# Patient Record
Sex: Female | Born: 1995 | Race: White | Hispanic: No | Marital: Single | Smoking: Current some day smoker
Health system: Southern US, Community
[De-identification: ages and names within clinical notes are randomized; demographics above are authoritative.]

## PROBLEM LIST (undated history)

## (undated) DIAGNOSIS — F419 Anxiety disorder, unspecified: Secondary | ICD-10-CM

## (undated) DIAGNOSIS — F429 Obsessive-compulsive disorder, unspecified: Secondary | ICD-10-CM

## (undated) DIAGNOSIS — F32A Depression, unspecified: Secondary | ICD-10-CM

## (undated) DIAGNOSIS — F329 Major depressive disorder, single episode, unspecified: Secondary | ICD-10-CM

## (undated) HISTORY — DX: Obsessive-compulsive disorder, unspecified: F42.9

## (undated) HISTORY — DX: Major depressive disorder, single episode, unspecified: F32.9

## (undated) HISTORY — DX: Anxiety disorder, unspecified: F41.9

## (undated) HISTORY — DX: Depression, unspecified: F32.A

---
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
## 2015-01-09 ENCOUNTER — Encounter: Payer: Self-pay | Admitting: *Deleted

## 2015-01-09 ENCOUNTER — Ambulatory Visit: Payer: BLUE CROSS/BLUE SHIELD | Admitting: Family Medicine

## 2015-01-21 ENCOUNTER — Encounter: Payer: Self-pay | Admitting: Family Medicine

## 2015-01-21 ENCOUNTER — Encounter: Payer: Self-pay | Admitting: *Deleted

## 2015-01-21 ENCOUNTER — Ambulatory Visit (INDEPENDENT_AMBULATORY_CARE_PROVIDER_SITE_OTHER): Payer: BLUE CROSS/BLUE SHIELD | Admitting: Family Medicine

## 2015-01-21 VITALS — BP 110/70 | HR 92 | Resp 16 | Ht 65.0 in | Wt 112.0 lb

## 2015-01-21 DIAGNOSIS — F17210 Nicotine dependence, cigarettes, uncomplicated: Secondary | ICD-10-CM

## 2015-01-21 DIAGNOSIS — F429 Obsessive-compulsive disorder, unspecified: Secondary | ICD-10-CM

## 2015-01-21 DIAGNOSIS — R062 Wheezing: Secondary | ICD-10-CM | POA: Insufficient documentation

## 2015-01-21 DIAGNOSIS — Z720 Tobacco use: Secondary | ICD-10-CM | POA: Diagnosis not present

## 2015-01-21 DIAGNOSIS — F42 Obsessive-compulsive disorder: Secondary | ICD-10-CM | POA: Diagnosis not present

## 2015-01-21 DIAGNOSIS — J3089 Other allergic rhinitis: Secondary | ICD-10-CM

## 2015-01-21 DIAGNOSIS — J309 Allergic rhinitis, unspecified: Secondary | ICD-10-CM | POA: Insufficient documentation

## 2015-01-21 MED ORDER — PREDNISONE 5 MG PO TABS: 5.0000 mg | ORAL_TABLET | Freq: Two times a day (BID) | ORAL | Status: AC

## 2015-01-21 MED ORDER — METHYLPREDNISOLONE ACETATE 80 MG/ML IJ SUSP
80.0000 mg | Freq: Once | INTRAMUSCULAR | Status: AC
  Administered 2015-01-21: 80 mg via INTRAMUSCULAR

## 2015-01-21 MED ORDER — AZELASTINE HCL 0.1 % NA SOLN: 2.0000 | Freq: Two times a day (BID) | NASAL | Status: DC

## 2015-01-21 MED ORDER — BUDESONIDE-FORMOTEROL FUMARATE 80-4.5 MCG/ACT IN AERO: 2.0000 | INHALATION_SPRAY | Freq: Two times a day (BID) | RESPIRATORY_TRACT | Status: DC

## 2015-01-21 MED ORDER — MONTELUKAST SODIUM 10 MG PO TABS: 10.0000 mg | ORAL_TABLET | Freq: Every day | ORAL | Status: AC

## 2015-01-21 NOTE — Patient Instructions (Addendum)
F/u as before, PLEASE contact me if still symptomatic despite maximal med therapy  You will be referred to allergistand lung specialist if needed  Stopping smoking will ABSOLUTELY reduce cough episodes    OK continuue daily  Allegra  Depo medrool today for uncontrolled allergies and 5 days of prednisone  Tylenol 500 mg one to two as needed for back pain, along with good back hygiene and massages/rubs

## 2015-01-21 NOTE — Progress Notes (Signed)
   Subjective:    Patient ID: Denise BelliniMadalyn Wang, female    DOB: 09/14/1995, 19 y.o.   MRN: [id number]  HPI    Denise BelliniMadalyn Rehfeld     MRN: [id number]      DOB: 07/08/1995   HPI Ms. Diona BrownerMcDowell is here for follow up and re-evaluation of chronic medical conditions, medication management and review of any available recent lab and radiology data.  Preventive health is updated, specifically  Cancer screening and Immunization.   C/o ongoing cough and wheezing which sometimes awakens her at night. Never been formally diagnosed with asthma as far as she can recall  C/o uncontrolled upper airway allergy symptoms, congestion, clear nasal and eye drainage and sneezing  ROS Denies recent fever or chills. . Denies chest pains, palpitations and leg swelling Denies abdominal pain, nausea, vomiting,diarrhea or constipation.   Denies dysuria, frequency, hesitancy or incontinence. Denies joint pain, swelling and limitation in mobility. Denies headaches, seizures, numbness, or tingling. Denies uncontrolled  depression, anxiety or insomnia. Denies skin break down or rash.   PE  BP 110/70 mmHg  Pulse 92  Resp 16  Ht 5\' 5"  (1.651 m)  Wt 112 lb (50.803 kg)  BMI 18.64 kg/m2  SpO2 98%  Patient alert and oriented and in no cardiopulmonary distress.  HEENT: No facial asymmetry, EOMI,   oropharynx pink and moist.  Neck supple no JVD, no mass.Eryhtema and edema of nasal mucosa, excessive sneezing and watery eyes  Chest: decreased though adequate air entry, scattered high pitched wheezes,no crackles  CVS: S1, S2 no murmurs, no S3.Regular rate.  ABD: Soft non tender.   Ext: No edema  MS: Adequate ROM spine, shoulders, hips and knees.  Skin: Intact, no ulcerations or rash noted.  Psych: Good eye contact, normal affect. Memory intact not anxious or depressed appearing.  CNS: CN 2-12 intact, power,  normal throughout.no focal deficits noted.   Assessment & Plan   Wheezing Uncontrolled, depo medrol  in office and 5 day course of oral steroid, refer for PFT, if normal , will need to see allergist or pulmonologist, hopefully test will definitively state whether pt is asthmatic or otherwise  Allergic rhinitis Daily allegra to continue ,will add  ingulair  Light cigarette smoker Patient counseled for approximately 5 minutes regarding the health risks of ongoing nicotine use, specifically all types of cancer, heart disease, stroke and respiratory failure. The options available for help with cessation ,the behavioral changes to assist the process, and the option to either gradully reduce usage  Or abruptly stop.is also discussed. Pt is also encouraged to set specific goals in number of cigarettes used daily, as well as to set a quit date.  Number of cigarettes/cigars currently smoking daily: 3 to 5  OCD (obsessive compulsive disorder) managed by psych and reports improvement in symptoms      Review of Systems     Objective:   Physical Exam        Assessment & Plan:

## 2015-01-25 ENCOUNTER — Other Ambulatory Visit (HOSPITAL_COMMUNITY): Payer: Self-pay | Admitting: Psychiatry

## 2015-01-31 ENCOUNTER — Ambulatory Visit (HOSPITAL_COMMUNITY)
Admission: RE | Admit: 2015-01-31 | Discharge: 2015-01-31 | Disposition: A | Discharge status: Home / Self Care | Payer: BLUE CROSS/BLUE SHIELD | Source: Ambulatory Visit | Attending: Family Medicine | Admitting: Family Medicine

## 2015-01-31 DIAGNOSIS — R062 Wheezing: Secondary | ICD-10-CM

## 2015-02-12 ENCOUNTER — Ambulatory Visit (HOSPITAL_COMMUNITY)
Admission: RE | Admit: 2015-02-12 | Discharge: 2015-02-12 | Disposition: A | Discharge status: Home / Self Care | Payer: BLUE CROSS/BLUE SHIELD | Source: Ambulatory Visit | Attending: Family Medicine | Admitting: Family Medicine

## 2015-02-12 DIAGNOSIS — R062 Wheezing: Secondary | ICD-10-CM | POA: Diagnosis present

## 2015-02-12 LAB — PULMONARY FUNCTION TEST
DL/VA % pred: 100 %
DL/VA: 4.93 ml/min/mmHg/L
DLCO unc % pred: 89 %
DLCO unc: 22.98 ml/min/mmHg
FEF 25-75 Pre: 2.11 L/s
FEF2575-%Pred-Pre: 55 %
FEV1-%Pred-Pre: 75 %
FEV1-Pre: 2.58 L
FEV1FVC-%Pred-Pre: 78 %
FEV6-%Pred-Pre: 96 %
FEV6-Pre: 3.79 L
FEV6FVC-%Pred-Pre: 100 %
FVC-%Pred-Pre: 97 %
Pre FEV1/FVC ratio: 68 %
Pre FEV6/FVC Ratio: 100 %
RV % pred: 203 %
RV: 2.48 L
TLC % pred: 116 %
TLC: 6.05 L

## 2015-02-25 ENCOUNTER — Other Ambulatory Visit (HOSPITAL_COMMUNITY): Payer: Self-pay | Admitting: Psychiatry

## 2015-03-17 ENCOUNTER — Other Ambulatory Visit: Payer: Self-pay | Admitting: Family Medicine

## 2015-03-30 NOTE — Assessment & Plan Note (Signed)
managed by psych and reports improvement in symptoms

## 2015-03-30 NOTE — Assessment & Plan Note (Addendum)
Uncontrolled, depo medrol in office and 5 day course of oral steroid, refer for PFT, if normal , will need to see allergist or pulmonologist, hopefully test will definitively state whether pt is asthmatic or otherwise

## 2015-03-30 NOTE — Assessment & Plan Note (Signed)
Patient counseled for approximately 5 minutes regarding the health risks of ongoing nicotine use, specifically all types of cancer, heart disease, stroke and respiratory failure. The options available for help with cessation ,the behavioral changes to assist the process, and the option to either gradully reduce usage  Or abruptly stop.is also discussed. Pt is also encouraged to set specific goals in number of cigarettes used daily, as well as to set a quit date.  Number of cigarettes/cigars currently smoking daily: 3 to 5  

## 2015-03-30 NOTE — Assessment & Plan Note (Addendum)
Daily allegra to continue ,will add  ingulair

## 2015-03-31 ENCOUNTER — Ambulatory Visit (HOSPITAL_COMMUNITY): Payer: Self-pay | Admitting: Psychiatry

## 2015-04-01 ENCOUNTER — Ambulatory Visit: Payer: Self-pay | Admitting: Family Medicine

## 2015-04-01 ENCOUNTER — Telehealth: Payer: Self-pay | Admitting: Family Medicine

## 2015-04-01 ENCOUNTER — Encounter: Payer: Self-pay | Admitting: *Deleted

## 2015-04-01 NOTE — Telephone Encounter (Signed)
Noted  

## 2015-04-01 NOTE — Telephone Encounter (Signed)
Noted and agree. 

## 2015-04-03 ENCOUNTER — Ambulatory Visit (INDEPENDENT_AMBULATORY_CARE_PROVIDER_SITE_OTHER): Payer: BLUE CROSS/BLUE SHIELD | Admitting: Psychiatry

## 2015-04-03 ENCOUNTER — Encounter (HOSPITAL_COMMUNITY): Payer: Self-pay | Admitting: Psychiatry

## 2015-04-03 VITALS — BP 119/69 | HR 96 | Ht 65.0 in | Wt 119.0 lb

## 2015-04-03 DIAGNOSIS — F321 Major depressive disorder, single episode, moderate: Secondary | ICD-10-CM | POA: Diagnosis not present

## 2015-04-03 MED ORDER — FLUOXETINE HCL 40 MG PO CAPS: 40.0000 mg | ORAL_CAPSULE | Freq: Every day | ORAL | Status: DC
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
## 2015-04-08 ENCOUNTER — Telehealth: Payer: Self-pay | Admitting: Family Medicine

## 2015-04-08 NOTE — Telephone Encounter (Addendum)
[name] ( her mother in law) reportedly called in requesting drug testing on this patient, stating that her behavior is currently questionable, and that she is at risk of "being cut off" Based on HIPPA law, I am unable to violate patient's privacy, I am unable to order the test as patient is an adult When I do see patient for an office visit , I will address her, Denise Wang's  stated health concerns, and go over her social history again, which specifically asks about illicit drug use, and only if her  her history and exam support a need for drug testing I will order the test Caller's name is not on list of contacts as far as I can tell, so we do not need to have further discussions with the caller about the patient if this is indeed substantiated.

## 2015-04-08 NOTE — Telephone Encounter (Signed)
Noted  

## 2015-04-08 NOTE — Telephone Encounter (Signed)
Noted and agree. 

## 2015-04-19 ENCOUNTER — Ambulatory Visit (HOSPITAL_COMMUNITY)
Admission: RE | Admit: 2015-04-19 | Discharge: 2015-04-19 | Disposition: A | Discharge status: Home / Self Care | Payer: BLUE CROSS/BLUE SHIELD | Attending: Psychiatry | Admitting: Psychiatry

## 2015-04-19 ENCOUNTER — Encounter (HOSPITAL_COMMUNITY): Payer: Self-pay | Admitting: *Deleted

## 2015-04-19 DIAGNOSIS — Z811 Family history of alcohol abuse and dependence: Secondary | ICD-10-CM | POA: Diagnosis not present

## 2015-04-19 DIAGNOSIS — Z818 Family history of other mental and behavioral disorders: Secondary | ICD-10-CM | POA: Insufficient documentation

## 2015-04-19 DIAGNOSIS — F17200 Nicotine dependence, unspecified, uncomplicated: Secondary | ICD-10-CM | POA: Insufficient documentation

## 2015-04-19 DIAGNOSIS — F1490 Cocaine use, unspecified, uncomplicated: Secondary | ICD-10-CM | POA: Diagnosis not present

## 2015-04-19 NOTE — BH Assessment (Signed)
Assessment Note  Denise Wang is an 19 y.o. female who presents to [facility] as a walk in. She states that she failed her drug test and that her parents told her that she needs help. Patient reports that 2 weeks ago her father and stepmother made her take a drug test for unspecified reasons. The drug test results came back today and showed that patient was positive for cocaine. Patient states that she did do a line of cocaine 2 weeks ago but denies chronic use of cocaine or any other substance. Patient denies any depressive symptoms and reports no SI/HI/AVH at this time. Patient reports that she does not desire inpatient services at this time and prefers to follow up outpatient if possible. Clinician consulted with Claudette Head, DNP who recommends outpatient information for substance counseling to be provided to patient. Patient does not meet inpatient criteria at this time.   Diagnosis: Cocaine Use Disorder  Past Medical History:  Past Medical History  Diagnosis Date  . Anxiety disorder 11/03/2012  . Anxiety   . Depression   . Obsessive-compulsive disorder     No past surgical history on file.  Family History:  Family History  Problem Relation Age of Onset  . Depression Mother   . Drug abuse Mother     etoh  . Depression Father   . Multiple sclerosis Father   . Bipolar disorder Paternal Aunt     Social History:  reports that she has been smoking.  She has never used smokeless tobacco. She reports that she drinks about 0.6 oz of alcohol per week. She reports that she uses illicit drugs (Cocaine).  Additional Social History:  Alcohol / Drug Use History of alcohol / drug use?: Yes Substance #1 Name of Substance 1: Cocaine 1 - Age of First Use: 16 1 - Amount (size/oz): varies 1 - Frequency: less than 10x in her life per patient 1 - Duration: years 1 - Last Use / Amount: 2 weeks ago- 1 line per patient Substance #2 Name of Substance 2: Alcohol 2 - Age of First Use: 15 2 - Amount  (size/oz): varies 2 - Frequency: rarely 2 - Duration: years 2 - Last Use / Amount: 1 week ago- 1 beer  CIWA:   COWS:    Allergies: No Known Allergies  Home Medications:  (Not in a hospital admission)  OB/GYN Status:  No LMP recorded.  General Assessment Data Location of Assessment: [facility] TTS Assessment: In system Is this a Tele or Face-to-Face Assessment?: Face-to-Face Is this an Initial Assessment or a Re-assessment for this encounter?: Initial Assessment Marital status: Single Maiden name: N/a Is patient pregnant?: No Pregnancy Status: No Living Arrangements: Other relatives (Sibling) Can pt return to current living arrangement?: Yes Admission Status: Voluntary Is patient capable of signing voluntary admission?: Yes Referral Source: Self/Family/Friend Insurance type: BCBS  Medical Screening Exam [facility] Walk-in ONLY) Medical Exam completed: No  Crisis Care Plan Living Arrangements: Other relatives (Sibling) Name of Psychiatrist: Dr. Tenny Craw ([facility] in [place]) Name of Therapist: None     Risk to self with the past 6 months Suicidal Ideation: No Has patient been a risk to self within the past 6 months prior to admission? : No Suicidal Intent: No Has patient had any suicidal intent within the past 6 months prior to admission? : No Is patient at risk for suicide?: No Suicidal Plan?: No Has patient had any suicidal plan within the past 6 months prior to admission? : No Access to  Means: No What has been your use of drugs/alcohol within the last 12 months?: Cocaine Previous Attempts/Gestures: No How many times?: 0 Other Self Harm Risks: None Triggers for Past Attempts: None known Intentional Self Injurious Behavior: None Family Suicide History: No Recent stressful life event(s): Conflict (Comment) (With stepmother) Persecutory voices/beliefs?: No Depression: No Substance abuse history and/or treatment for substance abuse?: Yes Suicide  prevention information given to non-admitted patients: Not applicable  Risk to Others within the past 6 months Homicidal Ideation: No Does patient have any lifetime risk of violence toward others beyond the six months prior to admission? : No Thoughts of Harm to Others: No Current Homicidal Intent: No Current Homicidal Plan: No Access to Homicidal Means: No Identified Victim: None History of harm to others?: No Assessment of Violence: None Noted Violent Behavior Description: Patient is calm and cooperative Does patient have access to weapons?: No Criminal Charges Pending?: No Does patient have a court date: No Is patient on probation?: No  Psychosis Hallucinations: None noted Delusions: None noted  Mental Status Report Appearance/Hygiene: Unremarkable Eye Contact: Good Motor Activity: Freedom of movement Speech: Logical/coherent Level of Consciousness: Alert Mood: Anxious Affect: Anxious Anxiety Level: Minimal Thought Processes: Coherent, Relevant Judgement: Impaired Orientation: Person, Place, Time, Situation Obsessive Compulsive Thoughts/Behaviors: None  Cognitive Functioning Concentration: Normal Memory: Recent Intact, Remote Intact IQ: Average Insight: Fair Impulse Control: Fair Appetite: Fair Weight Loss: 0 Weight Gain: 0 Sleep: No Change Total Hours of Sleep: 8 Vegetative Symptoms: None  ADLScreening [facility]) Patient's cognitive ability adequate to safely complete daily activities?: Yes Patient able to express need for assistance with ADLs?: Yes Independently performs ADLs?: Yes (appropriate for developmental age)  Prior Inpatient Therapy Prior Inpatient Therapy: No  Prior Outpatient Therapy Prior Outpatient Therapy: Yes Prior Therapy Dates: Current Prior Therapy Facilty/Provider(s): [facility] [place] Reason for Treatment: Medications Does patient have an ACCT team?: No Does patient have Intensive In-House  Services?  : No Does patient have [facility] services? : No Does patient have P4CC services?: No  ADL Screening (condition at time of admission) Patient's cognitive ability adequate to safely complete daily activities?: Yes Is the patient deaf or have difficulty hearing?: No Does the patient have difficulty seeing, even when wearing glasses/contacts?: No Does the patient have difficulty concentrating, remembering, or making decisions?: No Patient able to express need for assistance with ADLs?: Yes Does the patient have difficulty dressing or bathing?: No Independently performs ADLs?: Yes (appropriate for developmental age) Does the patient have difficulty walking or climbing stairs?: No Weakness of Legs: None Weakness of Arms/Hands: None  Home Assistive Devices/Equipment Home Assistive Devices/Equipment: None  Therapy Consults (therapy consults require a physician order) PT Evaluation Needed: No OT Evalulation Needed: No SLP Evaluation Needed: No Abuse/Neglect Assessment (Assessment to be complete while patient is alone) Physical Abuse: Denies Verbal Abuse: Denies Sexual Abuse: Denies Exploitation of patient/patient's resources: Denies Self-Neglect: Denies Values / Beliefs Cultural Requests During Hospitalization: None Spiritual Requests During Hospitalization: None Consults Spiritual Care Consult Needed: No Social Work Consult Needed: No Merchant navy officerAdvance Directives (For Healthcare) Does patient have an advance directive?: No Would patient like information on creating an advanced directive?: No - patient declined information    Additional Information 1:1 In Past 12 Months?: No CIRT Risk: No Elopement Risk: No Does patient have medical clearance?: Yes     Disposition:  Disposition Initial Assessment Completed for this Encounter: Yes Disposition of Patient: Outpatient treatment Type of outpatient treatment: Adult  On Site Evaluation by:  Reviewed with Physician:    Paulino Door, Leory Plowman 04/19/2015 11:33 AM

## 2015-05-05 ENCOUNTER — Telehealth: Payer: Self-pay | Admitting: Family Medicine

## 2015-05-05 NOTE — Telephone Encounter (Signed)
The order was placed in Epic and the results are back as well.

## 2015-05-05 NOTE — Telephone Encounter (Signed)
[name] from [facility] medical records is asking for a signed order from Dr[facility] Lod[facility] because she cant find an order in epic, please advise?

## 2015-05-05 NOTE — Telephone Encounter (Signed)
Left Message for [name]

## 2015-05-07 ENCOUNTER — Telehealth (HOSPITAL_COMMUNITY): Payer: Self-pay | Admitting: *Deleted

## 2015-05-07 NOTE — Telephone Encounter (Signed)
Opened in Error.

## 2015-05-19 ENCOUNTER — Other Ambulatory Visit: Payer: Self-pay | Admitting: Family Medicine

## 2015-05-21 ENCOUNTER — Ambulatory Visit (INDEPENDENT_AMBULATORY_CARE_PROVIDER_SITE_OTHER): Payer: BLUE CROSS/BLUE SHIELD | Admitting: Psychiatry

## 2015-05-21 ENCOUNTER — Encounter (HOSPITAL_COMMUNITY): Payer: Self-pay | Admitting: Psychiatry

## 2015-05-21 VITALS — BP 122/76 | HR 84 | Ht 65.0 in | Wt 119.0 lb

## 2015-05-21 DIAGNOSIS — F321 Major depressive disorder, single episode, moderate: Secondary | ICD-10-CM | POA: Diagnosis not present

## 2015-05-21 MED ORDER — FLUOXETINE HCL 40 MG PO CAPS
40.0000 mg | ORAL_CAPSULE | Freq: Every day | ORAL | Status: AC
Start: 2015-05-21 — End: 2016-05-20
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
## 2015-06-04 ENCOUNTER — Telehealth: Payer: Self-pay | Admitting: Family Medicine

## 2015-06-04 NOTE — Telephone Encounter (Signed)
Please contact pt and let her know that I would like her to come in for follow up of her general health conditions, and especially her wheezing and breathing. Please schedule her for next week, as soon as possible once she agrees and stress the importance of actually keeping the appointment  ( I have received a paper alleging home test of urine , sent in by her stepmother, signed as witnessed by her father Jake ChurchJason Saia starting 04/2015 showing positive test result possibly, unsure how to interpret, and certainly I do not kNOW that this result is her urine. She neds urine drug twesting in the office and I will attempt to have this done whenshe does come in next week) i  I will also encourage her to allow me to discuss her health with her father and get this in writing

## 2015-06-05 NOTE — Telephone Encounter (Signed)
I have left a message on her fathers Diona Foley(Jayson) voicemail to please call our office or have Joyce call our office to schedule an appointment

## 2015-06-16 ENCOUNTER — Other Ambulatory Visit: Payer: Self-pay | Admitting: Family Medicine

## 2015-08-04 ENCOUNTER — Encounter (HOSPITAL_COMMUNITY): Payer: Self-pay | Admitting: Psychiatry

## 2015-08-04 ENCOUNTER — Ambulatory Visit (HOSPITAL_COMMUNITY): Payer: Self-pay | Admitting: Psychiatry

## 2015-08-20 ENCOUNTER — Other Ambulatory Visit: Payer: Self-pay | Admitting: Family Medicine

## 2015-08-20 ENCOUNTER — Telehealth: Payer: Self-pay | Admitting: Family Medicine

## 2015-08-20 DIAGNOSIS — R062 Wheezing: Secondary | ICD-10-CM

## 2015-08-20 MED ORDER — BUDESONIDE-FORMOTEROL FUMARATE 80-4.5 MCG/ACT IN AERO: 2.0000 | INHALATION_SPRAY | Freq: Two times a day (BID) | RESPIRATORY_TRACT | Status: AC

## 2015-08-20 MED ORDER — ALBUTEROL SULFATE HFA 108 (90 BASE) MCG/ACT IN AERS: INHALATION_SPRAY | RESPIRATORY_TRACT | Status: AC

## 2015-08-20 NOTE — Telephone Encounter (Signed)
Patient is asking for a refill on budesonide-formoterol (SYMBICORT) 80-4.5 MCG/ACT inhaler and PROAIR HFA 108 (90 BASE) MCG/ACT inhaler

## 2015-08-20 NOTE — Telephone Encounter (Signed)
REFIL SENT WITH NOTE THAT APPT WAS NEEDED

## 2020-07-02 ENCOUNTER — Encounter: Payer: Self-pay | Admitting: Emergency Medicine

## 2020-07-02 ENCOUNTER — Ambulatory Visit
Admission: EM | Admit: 2020-07-02 | Discharge: 2020-07-02 | Disposition: A | Discharge status: Home / Self Care | Payer: Managed Care, Other (non HMO) | Attending: Family Medicine | Admitting: Family Medicine

## 2020-07-02 ENCOUNTER — Other Ambulatory Visit: Payer: Self-pay

## 2020-07-02 DIAGNOSIS — R519 Headache, unspecified: Secondary | ICD-10-CM

## 2020-07-02 DIAGNOSIS — Z1152 Encounter for screening for COVID-19: Secondary | ICD-10-CM

## 2020-07-02 DIAGNOSIS — B349 Viral infection, unspecified: Secondary | ICD-10-CM

## 2020-07-02 DIAGNOSIS — R509 Fever, unspecified: Secondary | ICD-10-CM | POA: Diagnosis not present

## 2020-07-02 DIAGNOSIS — R52 Pain, unspecified: Secondary | ICD-10-CM

## 2020-07-02 NOTE — ED Triage Notes (Addendum)
covid exposure at christmas, headache fever body aches since last night

## 2020-07-02 NOTE — ED Provider Notes (Signed)
[facility]   [id number] 07/02/20 Arrival Time: 1557   CC: COVID symptoms  SUBJECTIVE: History from: patient.  Frankee Gritz is a 24 y.o. female who presents with abrupt onset of fever, headache and body aches since last night. Reports covid exposure on 06/28/20. Denies recent travel. Has negative history of Covid. Has  completed Covid vaccines. Has not taken OTC medications for this. There are no aggravating or alleviating factors. Denies previous symptoms in the past. Denies  sinus pain, rhinorrhea, sore throat, SOB, wheezing, chest pain, nausea, changes in bowel or bladder habits.    ROS: As per HPI.  All other pertinent ROS negative.     Past Medical History:  Diagnosis Date  . Anxiety   . Anxiety disorder 11/03/2012  . Depression   . Obsessive-compulsive disorder    History reviewed. No pertinent surgical history. No Known Allergies No current facility-administered medications on file prior to encounter.   Current Outpatient Medications on File Prior to Encounter  Medication Sig Dispense Refill  . albuterol (PROAIR HFA) 108 (90 Base) MCG/ACT inhaler INHALE 1 OR 2 PUFFS BY MOUTH EVERY 4-6 HOURS AS NEEDED. 8.5 g 0  . budesonide-formoterol (SYMBICORT) 80-4.5 MCG/ACT inhaler Inhale 2 puffs into the lungs 2 (two) times daily. 1 Inhaler 0  . FLUoxetine (PROZAC) 40 MG capsule Take 1 capsule (40 mg total) by mouth daily. 30 capsule 3  . montelukast (SINGULAIR) 10 MG tablet Take 1 tablet (10 mg total) by mouth at bedtime. 30 tablet 4  . Norgestimate-Ethinyl Estradiol Triphasic (ORTHO TRI-CYCLEN LO) 0.18/0.215/0.25 MG-25 MCG tab Take 1 tablet by mouth daily. 1 Package 11   Social History   Socioeconomic History  . Marital status: Single    Spouse name: Not on file  . Number of children: Not on file  . Years of education: Not on file  . Highest education level: Not on file  Occupational History  . Not on file  Tobacco Use  . Smoking status: Current Some Day Smoker   . Smokeless tobacco: Never Used  Substance and Sexual Activity  . Alcohol use: Yes    Alcohol/week: 1.0 standard drink    Types: 1 Cans of beer per week    Comment: Patient reports drinking 1 beer 1 week ago  . Drug use: Yes    Types: Cocaine    Comment: Used 2 weeks ago  . Sexual activity: Not Currently  Other Topics Concern  . Not on file  Social History Narrative  . Not on file   Social Determinants of Health   Financial Resource Strain: Not on file  Food Insecurity: Not on file  Transportation Needs: Not on file  Physical Activity: Not on file  Stress: Not on file  Social Connections: Not on file  Intimate Partner Violence: Not on file   Family History  Problem Relation Age of Onset  . Depression Mother   . Drug abuse Mother        etoh  . Depression Father   . Multiple sclerosis Father   . Bipolar disorder Paternal Aunt     OBJECTIVE:  Vitals:   07/02/20 1655  BP: 116/76  Pulse: 99  Resp: 16  Temp: 99.7 F (37.6 C)  TempSrc: Oral  SpO2: 98%     General appearance: alert; appears fatigued, but nontoxic; speaking in full sentences and tolerating own secretions HEENT: NCAT; Ears: EACs clear, TMs pearly gray; Eyes: PERRL.  EOM grossly intact. Sinuses: nontender; Nose: nares patent without rhinorrhea, Throat: oropharynx  erythematous, cobblestoning present, tonsils non erythematous or enlarged, uvula midline  Neck: supple without LAD Lungs: unlabored respirations, symmetrical air entry; cough: absent; no respiratory distress; CTAB Heart: regular rate and rhythm.  Radial pulses 2+ symmetrical bilaterally Skin: warm and dry Psychological: alert and cooperative; normal mood and affect  LABS:  No results found for this or any previous visit (from the past 24 hour(s)).   ASSESSMENT & PLAN:  1. Viral illness   2. Encounter for screening for COVID-19   3. Fever, unspecified fever cause   4. Nonintractable headache, unspecified chronicity pattern, unspecified  headache type   5. Body aches    Continue supportive care at home COVID and flu testing ordered.  It will take between 1-2 days for test results.  Someone will contact you regarding abnormal results.   Work note provided Patient should remain in quarantine until they have received Covid results.  If negative you may resume normal activities (go back to work/school) while practicing hand hygiene, social distance, and mask wearing.  If positive, patient should remain in quarantine for 10 days from symptom onset AND greater than 72 hours after symptoms resolution (absence of fever without the use of fever-reducing medication and improvement in respiratory symptoms), whichever is longer Get plenty of rest and push fluids Use OTC zyrtec for nasal congestion, runny nose, and/or sore throat Use OTC flonase for nasal congestion and runny nose Use medications daily for symptom relief Use OTC medications like ibuprofen or tylenol as needed fever or pain Call or go to the ED if you have any new or worsening symptoms such as fever, worsening cough, shortness of breath, chest tightness, chest pain, turning blue, changes in mental status.  Reviewed expectations re: course of current medical issues. Questions answered. Outlined signs and symptoms indicating need for more acute intervention. Patient verbalized understanding. After Visit Summary given.         Moshe Cipro, NP 07/02/20 1818

## 2020-07-02 NOTE — Discharge Instructions (Signed)
Your COVID tests are pending.  You should self quarantine until the test results are back.    Take Tylenol or ibuprofen as needed for fever or discomfort.  Rest and keep yourself hydrated.    Follow-up with your primary care provider if your symptoms are not improving.     

## 2020-07-03 LAB — SARS-COV-2, NAA 2 DAY TAT

## 2020-07-03 LAB — NOVEL CORONAVIRUS, NAA: SARS-CoV-2, NAA: DETECTED — AB

## 2021-06-19 ENCOUNTER — Other Ambulatory Visit: Payer: Self-pay
